# Patient Record
Sex: Female | Born: 1995 | Race: Black or African American | Hispanic: No | Marital: Single | Smoking: Never smoker
Health system: Southern US, Community
[De-identification: ages and names within clinical notes are randomized; demographics above are authoritative.]

---

## 2016-11-19 ENCOUNTER — Emergency Department (HOSPITAL_COMMUNITY)
Admission: EM | Admit: 2016-11-19 | Discharge: 2016-11-19 | Disposition: A | Discharge status: Home / Self Care | Payer: Medicaid Other | Attending: Emergency Medicine | Admitting: Emergency Medicine

## 2016-11-19 ENCOUNTER — Encounter (HOSPITAL_COMMUNITY): Payer: Self-pay

## 2016-11-19 ENCOUNTER — Encounter (HOSPITAL_COMMUNITY): Payer: Self-pay | Admitting: Emergency Medicine

## 2016-11-19 ENCOUNTER — Emergency Department (HOSPITAL_COMMUNITY): Payer: Medicaid Other

## 2016-11-19 DIAGNOSIS — Y92410 Unspecified street and highway as the place of occurrence of the external cause: Secondary | ICD-10-CM | POA: Diagnosis not present

## 2016-11-19 DIAGNOSIS — Z5321 Procedure and treatment not carried out due to patient leaving prior to being seen by health care provider: Secondary | ICD-10-CM | POA: Diagnosis not present

## 2016-11-19 DIAGNOSIS — Z79899 Other long term (current) drug therapy: Secondary | ICD-10-CM | POA: Insufficient documentation

## 2016-11-19 DIAGNOSIS — S161XXA Strain of muscle, fascia and tendon at neck level, initial encounter: Secondary | ICD-10-CM | POA: Diagnosis not present

## 2016-11-19 DIAGNOSIS — M549 Dorsalgia, unspecified: Secondary | ICD-10-CM | POA: Diagnosis present

## 2016-11-19 DIAGNOSIS — Y999 Unspecified external cause status: Secondary | ICD-10-CM | POA: Diagnosis not present

## 2016-11-19 DIAGNOSIS — S29012A Strain of muscle and tendon of back wall of thorax, initial encounter: Secondary | ICD-10-CM | POA: Insufficient documentation

## 2016-11-19 DIAGNOSIS — Y939 Activity, unspecified: Secondary | ICD-10-CM | POA: Insufficient documentation

## 2016-11-19 DIAGNOSIS — M542 Cervicalgia: Secondary | ICD-10-CM | POA: Insufficient documentation

## 2016-11-19 DIAGNOSIS — T148XXA Other injury of unspecified body region, initial encounter: Secondary | ICD-10-CM

## 2016-11-19 LAB — POC URINE PREG, ED: Preg Test, Ur: NEGATIVE

## 2016-11-19 MED ORDER — CYCLOBENZAPRINE HCL 10 MG PO TABS: 10.0000 mg | ORAL_TABLET | Freq: Two times a day (BID) | ORAL | 0 refills | Status: AC | PRN

## 2016-11-19 MED ORDER — IBUPROFEN 600 MG PO TABS: 600.0000 mg | ORAL_TABLET | Freq: Four times a day (QID) | ORAL | 0 refills | Status: AC | PRN

## 2016-11-19 NOTE — ED Notes (Signed)
Patient left, per tech first. 
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
## 2016-11-19 NOTE — ED Notes (Addendum)
Pt approached desk inquiring about wait time; pt wanting c-collar removed and to leave; Triage RN notified; this tech removed c-collar per RN
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
## 2020-04-16 ENCOUNTER — Other Ambulatory Visit: Payer: Medicaid Other

## 2020-04-16 DIAGNOSIS — Z20822 Contact with and (suspected) exposure to covid-19: Secondary | ICD-10-CM

## 2020-04-17 LAB — NOVEL CORONAVIRUS, NAA: SARS-CoV-2, NAA: DETECTED — AB

## 2020-04-17 LAB — SARS-COV-2, NAA 2 DAY TAT

## 2021-05-15 ENCOUNTER — Other Ambulatory Visit: Payer: Self-pay

## 2021-05-15 ENCOUNTER — Ambulatory Visit
Admission: EM | Admit: 2021-05-15 | Discharge: 2021-05-15 | Disposition: A | Discharge status: Home / Self Care | Payer: Medicaid Other | Attending: Physician Assistant | Admitting: Physician Assistant

## 2021-05-15 ENCOUNTER — Emergency Department (HOSPITAL_COMMUNITY)
Admission: EM | Admit: 2021-05-15 | Discharge: 2021-05-15 | Disposition: A | Discharge status: Home / Self Care | Payer: Self-pay | Attending: Emergency Medicine | Admitting: Emergency Medicine

## 2021-05-15 ENCOUNTER — Emergency Department (HOSPITAL_COMMUNITY): Payer: Self-pay

## 2021-05-15 ENCOUNTER — Encounter: Payer: Self-pay | Admitting: Emergency Medicine

## 2021-05-15 ENCOUNTER — Encounter (HOSPITAL_COMMUNITY): Payer: Self-pay | Admitting: Emergency Medicine

## 2021-05-15 DIAGNOSIS — R319 Hematuria, unspecified: Secondary | ICD-10-CM | POA: Insufficient documentation

## 2021-05-15 DIAGNOSIS — R109 Unspecified abdominal pain: Secondary | ICD-10-CM | POA: Insufficient documentation

## 2021-05-15 DIAGNOSIS — R300 Dysuria: Secondary | ICD-10-CM | POA: Insufficient documentation

## 2021-05-15 DIAGNOSIS — R350 Frequency of micturition: Secondary | ICD-10-CM | POA: Insufficient documentation

## 2021-05-15 DIAGNOSIS — M545 Low back pain, unspecified: Secondary | ICD-10-CM | POA: Insufficient documentation

## 2021-05-15 LAB — CBC WITH DIFFERENTIAL/PLATELET
Abs Immature Granulocytes: 0.01 10*3/uL (ref 0.00–0.07)
Basophils Absolute: 0 10*3/uL (ref 0.0–0.1)
Basophils Relative: 1 %
Eosinophils Absolute: 0.1 10*3/uL (ref 0.0–0.5)
Eosinophils Relative: 1 %
HCT: 35.5 % — ABNORMAL LOW (ref 36.0–46.0)
Hemoglobin: 12.1 g/dL (ref 12.0–15.0)
Immature Granulocytes: 0 %
Lymphocytes Relative: 53 %
Lymphs Abs: 3 10*3/uL (ref 0.7–4.0)
MCH: 31.4 pg (ref 26.0–34.0)
MCHC: 34.1 g/dL (ref 30.0–36.0)
MCV: 92.2 fL (ref 80.0–100.0)
Monocytes Absolute: 0.4 10*3/uL (ref 0.1–1.0)
Monocytes Relative: 8 %
Neutro Abs: 2 10*3/uL (ref 1.7–7.7)
Neutrophils Relative %: 37 %
Platelets: 319 10*3/uL (ref 150–400)
RBC: 3.85 MIL/uL — ABNORMAL LOW (ref 3.87–5.11)
RDW: 11.6 % (ref 11.5–15.5)
WBC: 5.5 10*3/uL (ref 4.0–10.5)
nRBC: 0 % (ref 0.0–0.2)

## 2021-05-15 LAB — POCT URINALYSIS DIP (MANUAL ENTRY)
Bilirubin, UA: NEGATIVE
Glucose, UA: NEGATIVE mg/dL
Ketones, POC UA: NEGATIVE mg/dL
Leukocytes, UA: NEGATIVE
Nitrite, UA: NEGATIVE
Protein Ur, POC: NEGATIVE mg/dL
Spec Grav, UA: 1.02 (ref 1.010–1.025)
Urobilinogen, UA: 1 E.U./dL
pH, UA: 7 (ref 5.0–8.0)

## 2021-05-15 LAB — URINALYSIS, ROUTINE W REFLEX MICROSCOPIC
Bacteria, UA: NONE SEEN
Bilirubin Urine: NEGATIVE
Glucose, UA: NEGATIVE mg/dL
Ketones, ur: NEGATIVE mg/dL
Leukocytes,Ua: NEGATIVE
Nitrite: NEGATIVE
Protein, ur: NEGATIVE mg/dL
Specific Gravity, Urine: 1.023 (ref 1.005–1.030)
pH: 7 (ref 5.0–8.0)

## 2021-05-15 LAB — BASIC METABOLIC PANEL
Anion gap: 5 (ref 5–15)
BUN: 15 mg/dL (ref 6–20)
CO2: 26 mmol/L (ref 22–32)
Calcium: 8.8 mg/dL — ABNORMAL LOW (ref 8.9–10.3)
Chloride: 104 mmol/L (ref 98–111)
Creatinine, Ser: 0.79 mg/dL (ref 0.44–1.00)
GFR, Estimated: >60 mL/min (ref >60)
Glucose, Bld: 89 mg/dL (ref 70–99)
Potassium: 3.8 mmol/L (ref 3.5–5.1)
Sodium: 135 mmol/L (ref 135–145)

## 2021-05-15 LAB — POCT URINE PREGNANCY: Preg Test, Ur: NEGATIVE

## 2021-05-15 MED ORDER — NAPROXEN 500 MG PO TABS: 500.0000 mg | ORAL_TABLET | Freq: Two times a day (BID) | ORAL | 0 refills | Status: AC

## 2021-05-15 NOTE — Discharge Instructions (Addendum)
Your labs and CT scan were reassuring.  I saw small amount of blood in your urine but no signs of infection, no evidence of kidney stone or other abnormalities on your scan today.  You could have passed a kidney stone over the past few days or pain could be more musculoskeletal.  Take naproxen twice daily to help with pain, you can also do back exercises provided and alternate applying ice and heat to the area.  You can also use over-the-counter Salonpas lidocaine patches.  Follow-up with your regular doctor if pain is not improving or you continue to see blood in your urine.  Return for new or worsening symptoms.

## 2021-05-15 NOTE — ED Triage Notes (Signed)
Patient complains of a few months of LBP and new onset hematuria since Thursday with dysuria and frequency. Was seen at [facility] and told to go to the ED if she desired further testing.

## 2021-05-15 NOTE — ED Provider Notes (Signed)
[facility] DEPT Provider Note   CSN: [id number] Arrival date & time: 05/15/21  1303     History  Chief Complaint  Patient presents with   Hematuria   Back Pain    Christine Hensley is a 26 y.o. female.  Christine Hensley is a 27 y.o. female who is otherwise healthy, presents to the emergency department for evaluation of back pain and hematuria.  Patient reports that for the past few months she has noticed intermittent right-sided low back pain.  Over the past 3 days she has had some mild dysuria and urinary frequency and has noticed some blood in her urine and felt like the pain in her right back and flank became more severe or at least that she was paying closer attention to it in the setting of the hematuria.  She denies any associated fevers or chills.  No nausea or vomiting.  She has never noted hematuria previously and reports she is not currently on her menstrual cycle.  She denies any vaginal discharge, has not had any new partners and is not concerned for STD.  She has not taken anything for her symptoms prior to arrival.  Was initially seen at urgent care and had a UA not concerning for infection.  Urine culture sent.  The history is provided by medical records and the patient.  Hematuria Pertinent negatives include no abdominal pain.  Back Pain Associated symptoms: dysuria   Associated symptoms: no abdominal pain, no fever and no pelvic pain       Home Medications Prior to Admission medications   Medication Sig Start Date End Date Taking? Authorizing Provider  naproxen (NAPROSYN) 500 MG tablet Take 1 tablet (500 mg total) by mouth 2 (two) times daily. 05/15/21  Yes Jacqlyn Larsen, PA-C  cyclobenzaprine (FLEXERIL) 10 MG tablet Take 1 tablet (10 mg total) by mouth 2 (two) times daily as needed for muscle spasms. Patient not taking: Reported on 05/15/2021 11/19/16   Volanda Napoleon, PA-C  etonogestrel (NEXPLANON) 68 MG IMPL implant 1 each by Subdermal route  once.    [provider]  ibuprofen (ADVIL,MOTRIN) 600 MG tablet Take 1 tablet (600 mg total) by mouth every 6 (six) hours as needed. 11/19/16   Volanda Napoleon, PA-C  metroNIDAZOLE (METROGEL VAGINAL) 0.75 % vaginal gel Place 1 Applicatorful vaginally at bedtime for 5 days. 05/17/21 05/22/21  Chase Picket, MD      Allergies    Patient has no known allergies.    Review of Systems   Review of Systems  Constitutional:  Negative for chills and fever.  Gastrointestinal:  Negative for abdominal pain, nausea and vomiting.  Genitourinary:  Positive for dysuria, flank pain and hematuria. Negative for pelvic pain, vaginal bleeding and vaginal discharge.  Musculoskeletal:  Positive for back pain.  All other systems reviewed and are negative.  Physical Exam Updated Vital Signs BP 124/76 (BP Location: Right Arm)    Pulse 81    Temp 98.1 F (36.7 C) (Oral)    Resp 16    SpO2 98%  Physical Exam Vitals and nursing note reviewed.  Constitutional:      General: She is not in acute distress.    Appearance: Normal appearance. She is well-developed. She is not ill-appearing or diaphoretic.  HENT:     Head: Normocephalic and atraumatic.  Eyes:     General:        Right eye: No discharge.        Left  eye: No discharge.     Pupils: Pupils are equal, round, and reactive to light.  Cardiovascular:     Rate and Rhythm: Normal rate and regular rhythm.     Pulses: Normal pulses.     Heart sounds: Normal heart sounds.  Pulmonary:     Effort: Pulmonary effort is normal. No respiratory distress.     Breath sounds: Normal breath sounds. No wheezing or rales.     Comments: Respirations equal and unlabored, patient able to speak in full sentences, lungs clear to auscultation bilaterally  Abdominal:     General: Bowel sounds are normal. There is no distension.     Palpations: Abdomen is soft. There is no mass.     Tenderness: There is no abdominal tenderness. There is no right CVA tenderness,  left CVA tenderness or guarding.     Comments: Abdomen soft, nondistended, nontender to palpation in all quadrants without guarding or peritoneal signs, no CVA tenderness  Musculoskeletal:        General: No deformity.     Cervical back: Neck supple.     Comments: Mild tenderness over the right low back, no skin changes or palpable deformity. No midline tenderness  Skin:    General: Skin is warm and dry.     Capillary Refill: Capillary refill takes less than 2 seconds.  Neurological:     Mental Status: She is alert and oriented to person, place, and time.     Coordination: Coordination normal.     Comments: Speech is clear, able to follow commands Moves extremities without ataxia, coordination intact  Psychiatric:        Mood and Affect: Mood normal.        Behavior: Behavior normal.    ED Results / Procedures / Treatments   Labs (all labs ordered are listed, but only abnormal results are displayed) Labs Reviewed  URINALYSIS, ROUTINE W REFLEX MICROSCOPIC - Abnormal; Notable for the following components:      Result Value   Hgb urine dipstick SMALL (*)    All other components within normal limits  BASIC METABOLIC PANEL - Abnormal; Notable for the following components:   Calcium 8.8 (*)    All other components within normal limits  CBC WITH DIFFERENTIAL/PLATELET - Abnormal; Notable for the following components:   RBC 3.85 (*)    HCT 35.5 (*)    All other components within normal limits    EKG None  Radiology CT Renal Stone Study  Result Date: 05/15/2021 CLINICAL DATA:  Insert is EXAM: CT ABDOMEN AND PELVIS WITHOUT CONTRAST TECHNIQUE: Multidetector CT imaging of the abdomen and pelvis was performed following the standard protocol without IV contrast. RADIATION DOSE REDUCTION: This exam was performed according to the departmental dose-optimization program which includes automated exposure control, adjustment of the mA and/or kV according to patient size and/or use of iterative  reconstruction technique. COMPARISON:  None. FINDINGS: Lower chest: No acute abnormality. Hepatobiliary: No focal liver abnormality is seen. The gallbladder is unremarkable. Pancreas: Unremarkable. No pancreatic ductal dilatation or surrounding inflammatory changes. Spleen: Normal in size without focal abnormality. Adrenals/Urinary Tract: Adrenal glands are unremarkable. No hydronephrosis or nephroureterolithiasis. Bladder is unremarkable. Stomach/Bowel: The stomach is within normal limits. There is no evidence of bowel obstruction.The appendix is normal. Vascular/Lymphatic: No significant vascular findings are present. No enlarged abdominal or pelvic lymph nodes. Reproductive: Unremarkable for age. Other: No abdominal wall hernia or abnormality. No abdominopelvic ascites. Musculoskeletal: No acute or significant osseous findings. IMPRESSION: No acute abdominopelvic  abnormality.  Normal appendix. No hydronephrosis or nephroureterolithiasis. Electronically Signed   By: Maurine Simmering M.D.   On: 05/15/2021 15:35     Procedures Procedures    Medications Ordered in ED Medications - No data to display  ED Course/ Medical Decision Making/ A&P                           26 y.o. female presents to the ED with complaints of, dysuria, flank pain, back pain, this involves an extensive number of treatment options, and is a complaint that carries with it a high risk of complications and morbidity.  The differential diagnosis includes UTI, pyelonephritis, nephrolithiasis, musculoskeletal back pain   On arrival pt is nontoxic, vitals WNL. Exam significant for mild right low back tenderness, no CVA tenderness or anterior abdominal tenderness  Additional history obtained from medical records. Previous records obtained and reviewed including recent urgent care visit  Lab Tests:  I Ordered, reviewed, and interpreted labs, which included: No leukocytosis, normal hemoglobin, no significant electrolyte derangements,  normal renal function, UA with small hemoglobin, no RBCs seen and no signs of infection, had negative pregnancy at urgent care and urine culture sent.    Imaging Studies ordered:  I ordered imaging studies which included CT renal, I independently visualized and interpreted imaging which showed no evidence of nephrolithiasis or hydronephrosis  ED Course:   Work-up today is reassuring with no significant hematuria or signs of infection on UA, no evidence of kidney stone, hydronephrosis or other abnormalities on imaging.  Suspect musculoskeletal back pain, unclear etiology for hematuria but not significantly noted on UA today.  We will have patient follow-up with urgent care regarding urine culture and follow-up with her PCP if she continues to notice any hematuria, given reassuring work-up feel patient is appropriate for discharge home with treatment for musculoskeletal back pain.    Portions of this note were generated with [employer]. Dictation errors may occur despite best attempts at proofreading.         Final Clinical Impression(s) / ED Diagnoses Final diagnoses:  Hematuria, unspecified type  Right flank pain    Rx / DC Orders ED Discharge Orders          Ordered    naproxen (NAPROSYN) 500 MG tablet  2 times daily        05/15/21 1623              Jacqlyn Larsen, [place] 05/20/21 2340    Varney Biles, MD 05/23/21 0745

## 2021-05-15 NOTE — ED Triage Notes (Signed)
Pt here for dysuria x 3 days with some blood noted

## 2021-05-15 NOTE — ED Provider Notes (Signed)
[facility]    CSN: [id number] Arrival date & time: 05/15/21  1037      History   Chief Complaint Chief Complaint  Patient presents with   Dysuria    HPI Christine Hensley is a 26 y.o. female.   Patient here today for evaluation of dysuria that she has had for 3 days.  She states she did notice some blood in her urine as well.  She has not had any abdominal pain.  She does report some right-sided flank pain.  She denies any nausea vomiting or fever.  The history is provided by the patient.   History reviewed. No pertinent past medical history.  There are no problems to display for this patient.   History reviewed. No pertinent surgical history.  OB History   No obstetric history on file.      Home Medications    Prior to Admission medications   Medication Sig Start Date End Date Taking? Authorizing Provider  cyclobenzaprine (FLEXERIL) 10 MG tablet Take 1 tablet (10 mg total) by mouth 2 (two) times daily as needed for muscle spasms. Patient not taking: Reported on 05/15/2021 11/19/16   Volanda Napoleon, PA-C  etonogestrel (NEXPLANON) 68 MG IMPL implant 1 each by Subdermal route once.    [provider]  ibuprofen (ADVIL,MOTRIN) 600 MG tablet Take 1 tablet (600 mg total) by mouth every 6 (six) hours as needed. 11/19/16   Volanda Napoleon, PA-C    Family History History reviewed. No pertinent family history.  Social History Social History   Tobacco Use   Smoking status: Never   Smokeless tobacco: Never  Substance Use Topics   Alcohol use: No   Drug use: No     Allergies   Patient has no known allergies.   Review of Systems Review of Systems  Constitutional:  Negative for chills and fever.  Eyes:  Negative for discharge and redness.  Respiratory:  Negative for shortness of breath.   Gastrointestinal:  Negative for abdominal pain, nausea and vomiting.  Genitourinary:  Positive for dysuria and hematuria. Negative for vaginal discharge.   Musculoskeletal:  Positive for back pain.    Physical Exam Triage Vital Signs ED Triage Vitals  Enc Vitals Group     BP      Pulse      Resp      Temp      Temp src      SpO2      Weight      Height      Head Circumference      Peak Flow      Pain Score      Pain Loc      Pain Edu?      Excl. in [place]?    No data found.  Updated Vital Signs BP 126/89 (BP Location: Left Arm)    Pulse 84    Temp 98.7 F (37.1 C) (Oral)    Resp 18    SpO2 98%       Physical Exam Vitals and nursing note reviewed.  Constitutional:      General: She is not in acute distress.    Appearance: Normal appearance. She is not ill-appearing.  HENT:     Head: Normocephalic and atraumatic.  Eyes:     Conjunctiva/sclera: Conjunctivae normal.  Cardiovascular:     Rate and Rhythm: Normal rate.  Pulmonary:     Effort: Pulmonary effort is normal.  Neurological:     Mental  Status: She is alert.  Psychiatric:        Mood and Affect: Mood normal.        Behavior: Behavior normal.        Thought Content: Thought content normal.     UC Treatments / Results  Labs (all labs ordered are listed, but only abnormal results are displayed) Labs Reviewed  POCT URINALYSIS DIP (MANUAL ENTRY) - Abnormal; Notable for the following components:      Result Value   Blood, UA trace-lysed (*)    All other components within normal limits  POCT URINE PREGNANCY  CERVICOVAGINAL ANCILLARY ONLY    EKG   Radiology No results found.  Procedures Procedures (including critical care time)  Medications Ordered in UC Medications - No data to display  Initial Impression / Assessment and Plan / UC Course  I have reviewed the triage vital signs and the nursing notes.  Pertinent labs & imaging results that were available during my care of the patient were reviewed by me and considered in my medical decision making (see chart for details).   UA with no sign of UTI- will order culture but also recommended STD  screening. Recommended further evaluation in ED if flank pain worsens as we cannot rule out kidney stone. Patient expresses understanding.    Final Clinical Impressions(s) / UC Diagnoses   Final diagnoses:  Dysuria   Discharge Instructions   None    ED Prescriptions   None    PDMP not reviewed this encounter.   Francene Finders, PA-C 05/15/21 1257

## 2021-05-17 ENCOUNTER — Telehealth (HOSPITAL_COMMUNITY): Payer: Self-pay | Admitting: Emergency Medicine

## 2021-05-17 LAB — URINE CULTURE

## 2021-05-17 LAB — CERVICOVAGINAL ANCILLARY ONLY
Bacterial Vaginitis (gardnerella): POSITIVE — AB
Candida Glabrata: NEGATIVE
Candida Vaginitis: NEGATIVE
Chlamydia: NEGATIVE
Comment: NEGATIVE
Comment: NEGATIVE
Comment: NEGATIVE
Comment: NEGATIVE
Comment: NEGATIVE
Comment: NORMAL
Neisseria Gonorrhea: NEGATIVE
Trichomonas: NEGATIVE

## 2021-05-17 MED ORDER — METRONIDAZOLE 0.75 % VA GEL: 1.0000 | Freq: Every day | VAGINAL | 0 refills | Status: AC

## 2023-03-14 IMAGING — CT CT RENAL STONE PROTOCOL
2 of 4 series · 16 of 46 positions shown, 18 images · non-contrast
Comparison: None.

CLINICAL DATA: Insert is



[Series 2: axial st · axial · 0.66mm/px · z∈[+1064,+1448]mm · 13 of 87 slices shown, 15 images]
[im 5/87  soft-tissue]
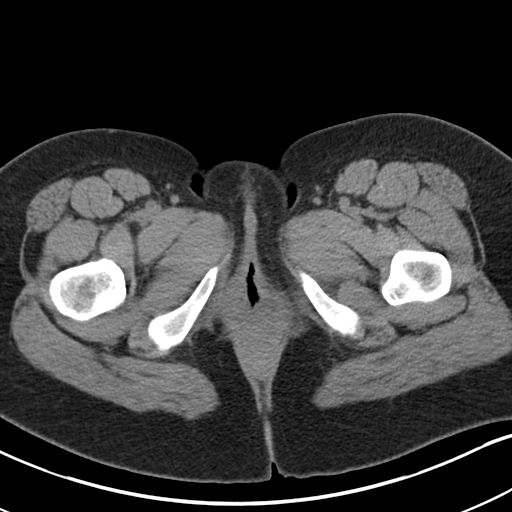
[im 5/87  bone]
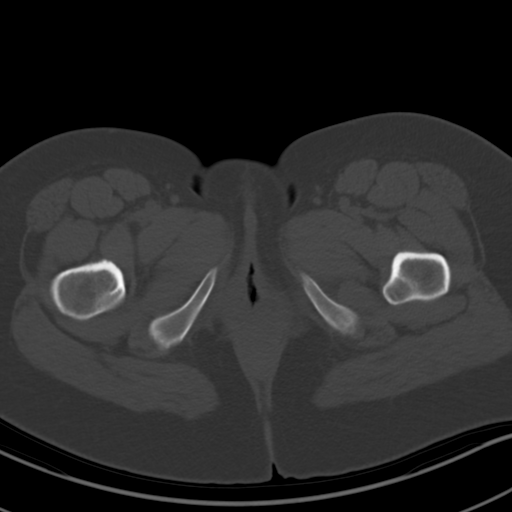
[im 13/87  soft-tissue]
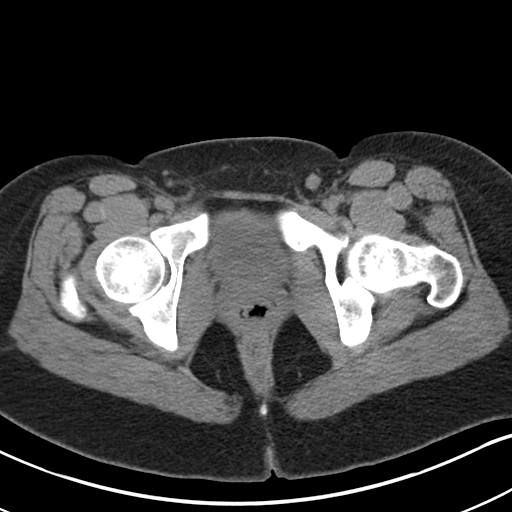
[im 18/87  soft-tissue]
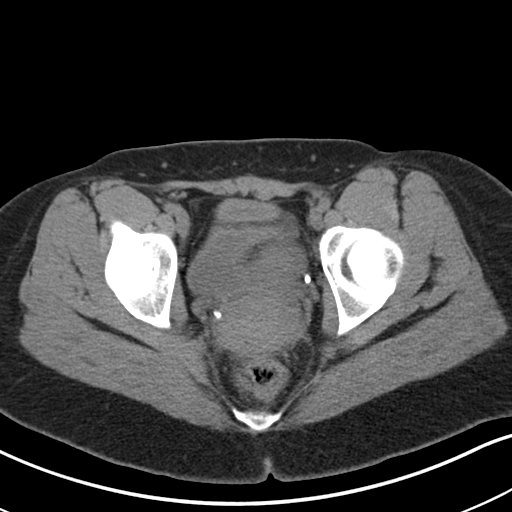
[im 26/87  soft-tissue]
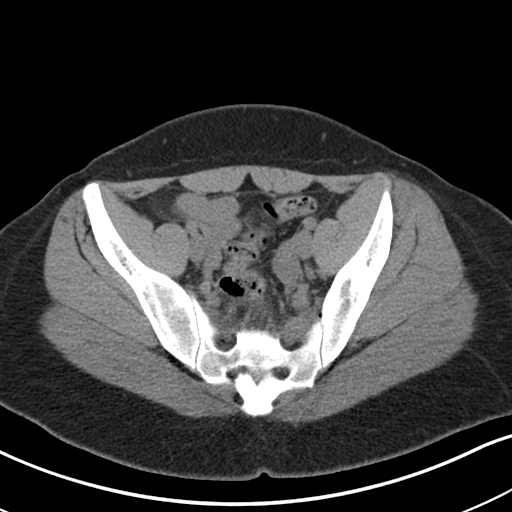
[im 31/87  soft-tissue]
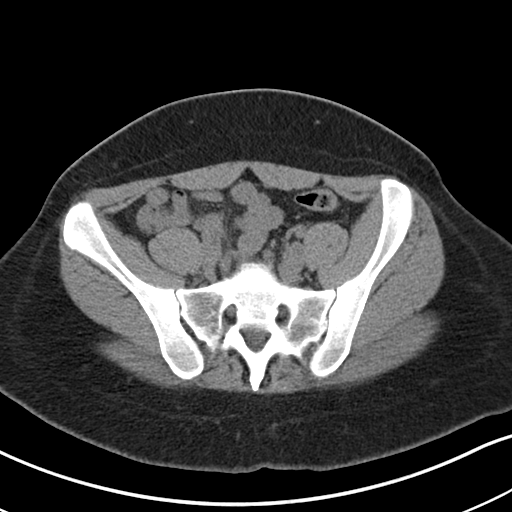
[im 39/87  soft-tissue]
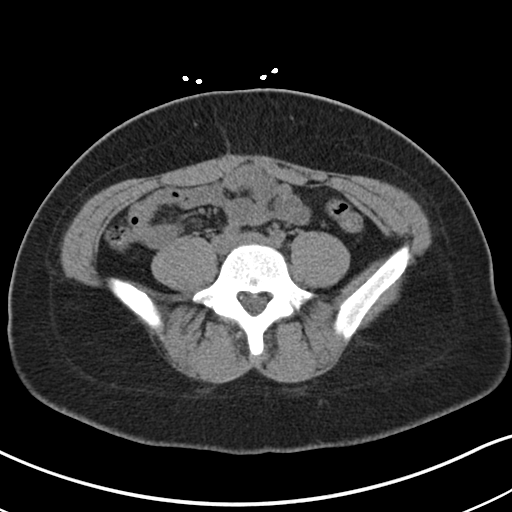
[im 44/87  soft-tissue]
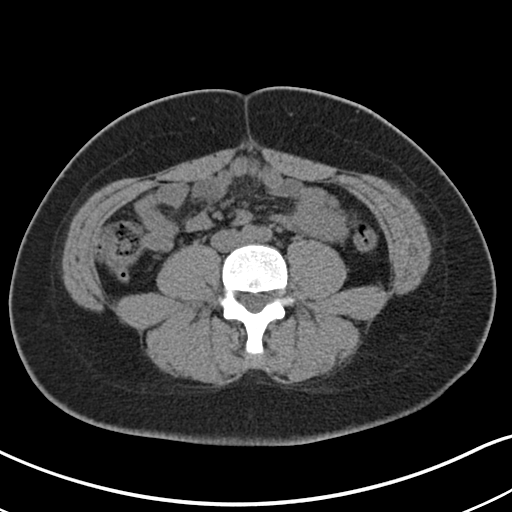
[im 48/87  soft-tissue]
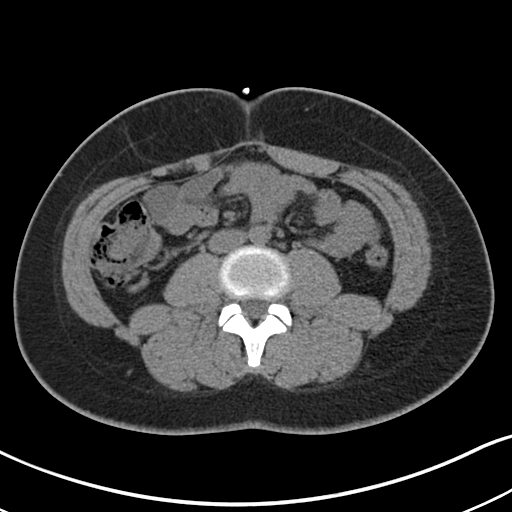
[im 56/87  soft-tissue]
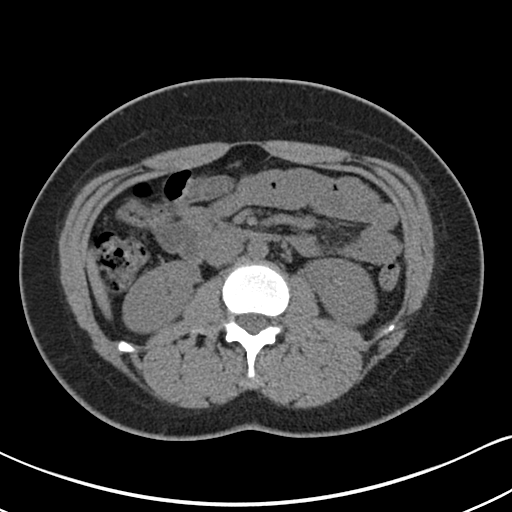
[im 56/87  bone]
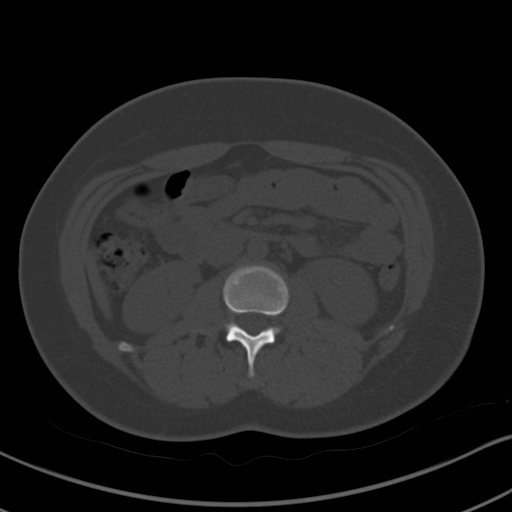
[im 61/87  soft-tissue]
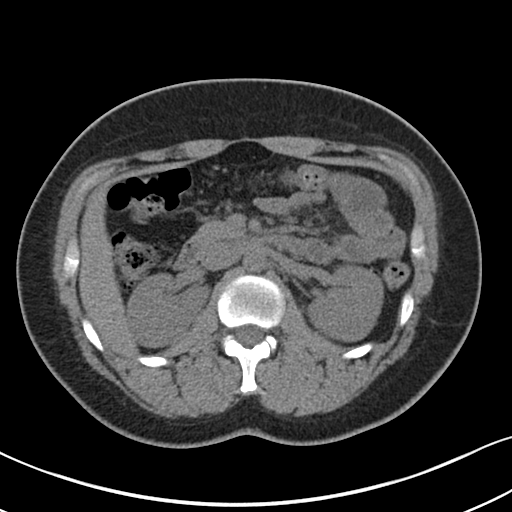
[im 69/87  soft-tissue]
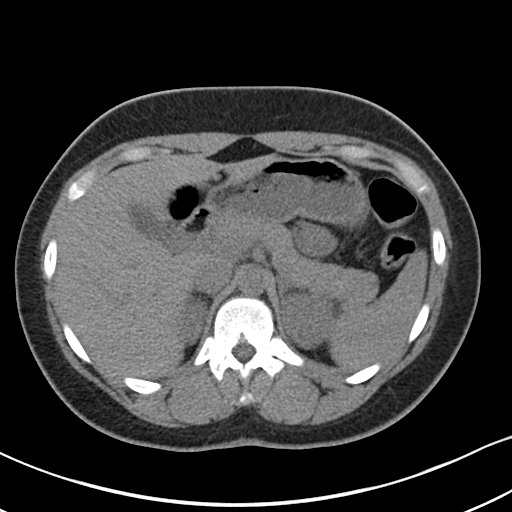
[im 74/87  soft-tissue]
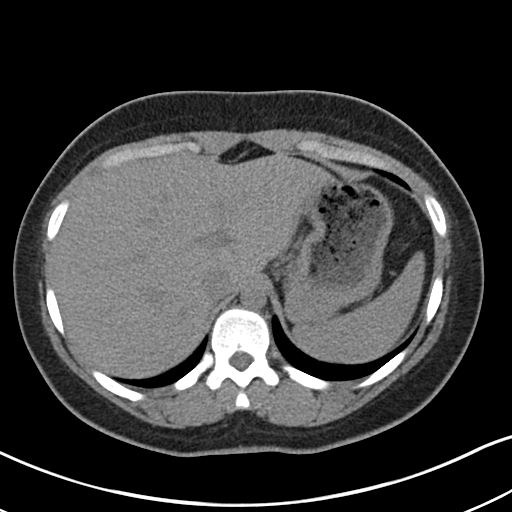
[im 82/87  soft-tissue]
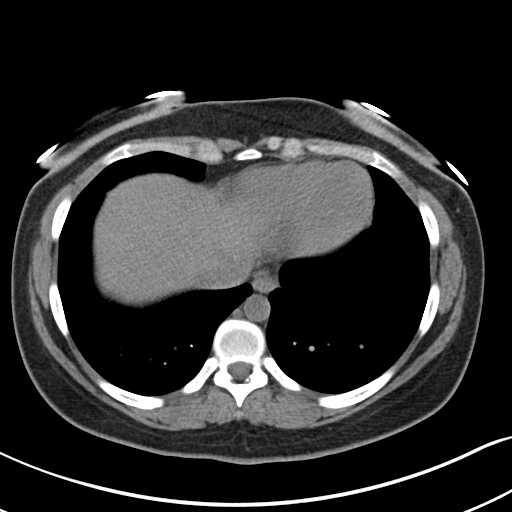

[Series 4: coronal · coronal · 0.71mm/px · 3 of 135 slices shown]
[im 45/135  soft-tissue]
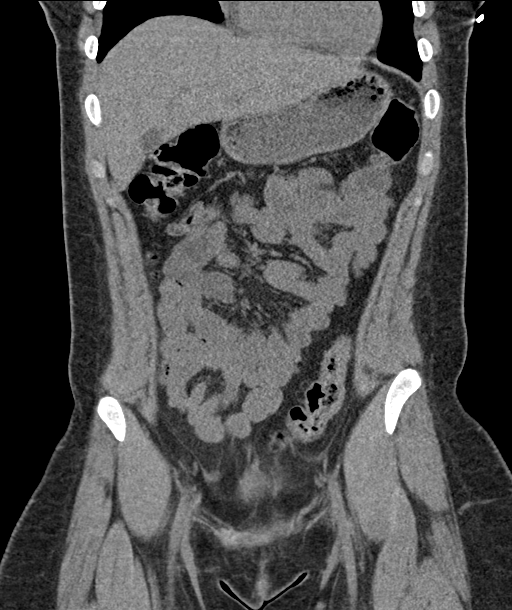
[im 60/135  soft-tissue]
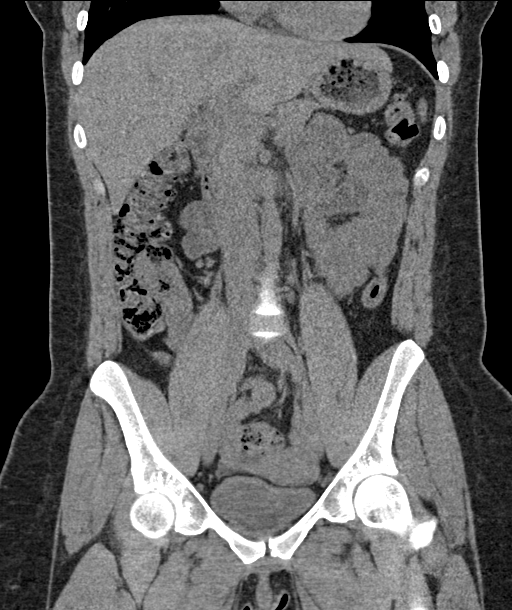
[im 75/135  soft-tissue]
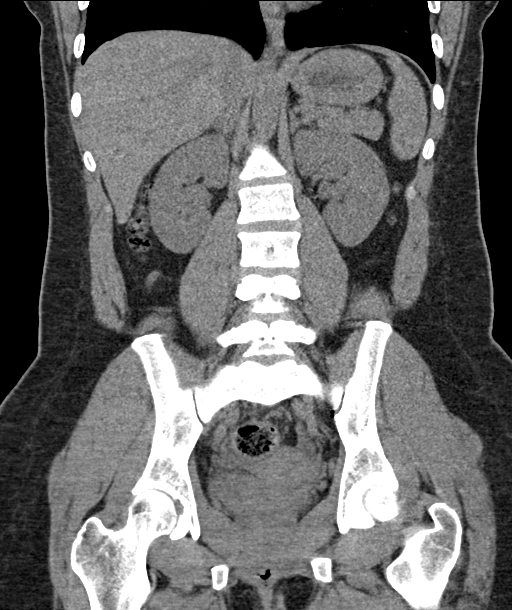

[16 of 46 positions shown; findings below may reference images not displayed]

FINDINGS: Lower chest: No acute abnormality.

Hepatobiliary: No focal liver abnormality is seen. The gallbladder
is unremarkable.

Pancreas: Unremarkable. No pancreatic ductal dilatation or
surrounding inflammatory changes.

Spleen: Normal in size without focal abnormality.

Adrenals/Urinary Tract: Adrenal glands are unremarkable. No
hydronephrosis or nephroureterolithiasis. Bladder is unremarkable.

Stomach/Bowel: The stomach is within normal limits. There is no
evidence of bowel obstruction.The appendix is normal.

Vascular/Lymphatic: No significant vascular findings are present. No
enlarged abdominal or pelvic lymph nodes.

Reproductive: Unremarkable for age.

Other: No abdominal wall hernia or abnormality. No abdominopelvic
ascites.

Musculoskeletal: No acute or significant osseous findings.
IMPRESSION: No acute abdominopelvic abnormality.  Normal appendix.

No hydronephrosis or nephroureterolithiasis.
